# Patient Record
Sex: Male | Born: 1990 | Race: Black or African American | Hispanic: No | Marital: Married | Smoking: Former smoker
Health system: Southern US, Community
[De-identification: ages and names within clinical notes are randomized; demographics above are authoritative.]

## PROBLEM LIST (undated history)

## (undated) DIAGNOSIS — N400 Enlarged prostate without lower urinary tract symptoms: Secondary | ICD-10-CM

---

## 2020-02-16 ENCOUNTER — Encounter: Payer: Self-pay | Admitting: Emergency Medicine

## 2020-02-16 ENCOUNTER — Emergency Department
Admission: EM | Admit: 2020-02-16 | Discharge: 2020-02-16 | Disposition: A | Discharge status: Home / Self Care | Payer: BC Managed Care – PPO | Attending: Emergency Medicine | Admitting: Emergency Medicine

## 2020-02-16 ENCOUNTER — Other Ambulatory Visit: Payer: Self-pay

## 2020-02-16 ENCOUNTER — Emergency Department: Payer: BC Managed Care – PPO

## 2020-02-16 DIAGNOSIS — I301 Infective pericarditis: Secondary | ICD-10-CM

## 2020-02-16 DIAGNOSIS — Z87891 Personal history of nicotine dependence: Secondary | ICD-10-CM | POA: Insufficient documentation

## 2020-02-16 DIAGNOSIS — R079 Chest pain, unspecified: Secondary | ICD-10-CM | POA: Diagnosis present

## 2020-02-16 DIAGNOSIS — R071 Chest pain on breathing: Secondary | ICD-10-CM | POA: Diagnosis not present

## 2020-02-16 DIAGNOSIS — B3323 Viral pericarditis: Secondary | ICD-10-CM | POA: Insufficient documentation

## 2020-02-16 HISTORY — DX: Benign prostatic hyperplasia without lower urinary tract symptoms: N40.0

## 2020-02-16 LAB — COMPREHENSIVE METABOLIC PANEL
ALT: 20 U/L (ref 0–44)
AST: 42 U/L — ABNORMAL HIGH (ref 15–41)
Albumin: 4.4 g/dL (ref 3.5–5.0)
Alkaline Phosphatase: 42 U/L (ref 38–126)
Anion gap: 9 (ref 5–15)
BUN: 11 mg/dL (ref 6–20)
CO2: 26 mmol/L (ref 22–32)
Calcium: 9.3 mg/dL (ref 8.9–10.3)
Chloride: 102 mmol/L (ref 98–111)
Creatinine, Ser: 1 mg/dL (ref 0.61–1.24)
GFR calc Af Amer: >60 mL/min (ref >60)
GFR calc non Af Amer: >60 mL/min (ref >60)
Glucose, Bld: 118 mg/dL — ABNORMAL HIGH (ref 70–99)
Potassium: 3.4 mmol/L — ABNORMAL LOW (ref 3.5–5.1)
Sodium: 137 mmol/L (ref 135–145)
Total Bilirubin: 0.7 mg/dL (ref 0.3–1.2)
Total Protein: 7.8 g/dL (ref 6.5–8.1)

## 2020-02-16 LAB — TROPONIN I (HIGH SENSITIVITY)
Troponin I (High Sensitivity): 2 ng/L (ref <18)
Troponin I (High Sensitivity): <2 ng/L (ref <18)

## 2020-02-16 LAB — CBC
HCT: 43.1 % (ref 39.0–52.0)
Hemoglobin: 15.2 g/dL (ref 13.0–17.0)
MCH: 29.9 pg (ref 26.0–34.0)
MCHC: 35.3 g/dL (ref 30.0–36.0)
MCV: 84.7 fL (ref 80.0–100.0)
Platelets: 267 10*3/uL (ref 150–400)
RBC: 5.09 MIL/uL (ref 4.22–5.81)
RDW: 13 % (ref 11.5–15.5)
WBC: 7.5 10*3/uL (ref 4.0–10.5)
nRBC: 0 % (ref 0.0–0.2)

## 2020-02-16 LAB — SEDIMENTATION RATE: Sed Rate: 8 mm/hr (ref 0–15)

## 2020-02-16 LAB — LIPASE, BLOOD: Lipase: 34 U/L (ref 11–51)

## 2020-02-16 MED ORDER — LORAZEPAM 0.5 MG PO TABS
0.5000 mg | ORAL_TABLET | Freq: Once | ORAL | Status: AC
  Administered 2020-02-16: 0.5 mg via ORAL
  Filled 2020-02-16: qty 1

## 2020-02-16 MED ORDER — KETOROLAC TROMETHAMINE 30 MG/ML IJ SOLN
INTRAMUSCULAR | Status: AC
  Administered 2020-02-16: 15 mg via INTRAVENOUS
  Filled 2020-02-16: qty 1

## 2020-02-16 MED ORDER — COLCHICINE 0.6 MG PO TABS: 0.6000 mg | ORAL_TABLET | Freq: Two times a day (BID) | ORAL | 0 refills | Status: DC

## 2020-02-16 MED ORDER — KETOROLAC TROMETHAMINE 30 MG/ML IJ SOLN: 15.0000 mg | Freq: Once | INTRAMUSCULAR | Status: AC

## 2020-02-16 MED ORDER — COLCHICINE 0.6 MG PO TABS
0.6000 mg | ORAL_TABLET | Freq: Once | ORAL | Status: AC
  Administered 2020-02-16: 0.6 mg via ORAL
  Filled 2020-02-16: qty 1

## 2020-02-16 NOTE — Discharge Instructions (Addendum)
You were seen in the ED because of your chest pain.  Due to your EKG changes and story, I am concerned about acute pericarditis.  This is inflammation of the sac around your heart.  Young people often get this after a viral infection like a cold.  Thankfully, this is very treatable.  Use a combination of anti-inflammatory medicines and colchicine.  You are being discharged with a prescription for colchicine to take twice daily for the next 3 months. Use ibuprofen in conjunction with this.  Please follow-up with your PCP within the next 3-5 days to get a repeat EKG and to ensure your symptoms are improving.  If you develop any worsening symptoms of chest pain, any fevers or passing out with your symptoms, please return to the ED.

## 2020-02-16 NOTE — ED Triage Notes (Signed)
Patient states that he started having central chest pain with shortness of breath that started about 17:30 today. Patient denies nausea or vomiting.

## 2020-02-16 NOTE — ED Notes (Signed)
Explanation of repeat troponin time provided to pt who verbalizes understanding.

## 2020-02-16 NOTE — ED Provider Notes (Signed)
[facility] Emergency Department Provider Note ____________________________________________   First MD Initiated Contact with Patient 02/16/20 1959     (approximate)  I have reviewed the triage vital signs and the nursing notes.  HISTORY  Chief Complaint Chest Pain   HPI Eddie Williamson is a 29 y.o. malewho presents to the ED for evaluation of chest pain.   Chart review indicates no relevant medical history. Patient denies any cardiac history or family history of early cardiac disease/death.  He does report that his older sister had a stroke in her 30s.  Patient presents with his wife who provides supplemental history.  Patient reports a sore throat and "the sniffles" 2-3 days ago that have since self resolved.  He reports going to a soccer game with his child this afternoon, feeling well, then taking a nap after this.  He reports waking up from a nap at about 1730 with severe chest pain.  He reports 8/10 intensity central chest pain that is nonradiating, aching in nature and he has not taken any medications prior to arrival to help alleviate this pain.  He has never felt like this before.  He reports pain is pleuritic and positional, worsening with leaning forward.    Past Medical History:  Diagnosis Date  . Enlarged prostate     There are no problems to display for this patient.   History reviewed. No pertinent surgical history.  Prior to Admission medications   Medication Sig Start Date End Date Taking? Authorizing Provider  colchicine 0.6 MG tablet Take 1 tablet (0.6 mg total) by mouth 2 (two) times daily. 02/16/20 05/16/20  Delton Prairie, MD    Allergies Gramineae pollens  No family history on file.  Social History Social History   Tobacco Use  . Smoking status: Former Games developer  . Smokeless tobacco: Never Used  Substance Use Topics  . Alcohol use: Yes  . Drug use: Yes    Types: Marijuana    Review of Systems  Constitutional: No  fever/chills Eyes: No visual changes. ENT: No sore throat. Cardiovascular: Positive for chest pain. Respiratory: Denies shortness of breath. Gastrointestinal: No abdominal pain.  No nausea, no vomiting.  No diarrhea.  No constipation. Genitourinary: Negative for dysuria. Musculoskeletal: Negative for back pain. Skin: Negative for rash. Neurological: Negative for headaches, focal weakness or numbness.  ____________________________________________   PHYSICAL EXAM:  VITAL SIGNS: Vitals:   02/16/20 2200 02/16/20 2230  BP: 116/79 120/78  Pulse: (!) 57 63  Resp: 14 16  Temp:    SpO2: 100% 99%      Constitutional: Alert and oriented. Uncomfortable-appearing, but conversational in full sentences.  Eyes: Conjunctivae are normal. PERRL. EOMI. Head: Atraumatic. Nose: No congestion/rhinnorhea. Mouth/Throat: Mucous membranes are moist.  Oropharynx non-erythematous. Neck: No stridor. No cervical spine tenderness to palpation. Cardiovascular: Normal rate, regular rhythm. Grossly normal heart sounds.  Good peripheral circulation. Respiratory: Normal respiratory effort.  No retractions. Lungs CTAB. Gastrointestinal: Soft , nondistended, nontender to palpation. No abdominal bruits. No CVA tenderness. Musculoskeletal: No lower extremity tenderness nor edema.  No joint effusions. No signs of acute trauma. Neurologic:  Normal speech and language. No gross focal neurologic deficits are appreciated. No gait instability noted. Skin:  Skin is warm, dry and intact. No rash noted. Psychiatric: Mood and affect are normal. Speech and behavior are normal.  ____________________________________________   LABS (all labs ordered are listed, but only abnormal results are displayed)  Labs Reviewed  COMPREHENSIVE METABOLIC PANEL - Abnormal; Notable for the following  components:      Result Value   Potassium 3.4 (*)    Glucose, Bld 118 (*)    AST 42 (*)    All other components within normal limits    CBC  LIPASE, BLOOD  SEDIMENTATION RATE  C-REACTIVE PROTEIN  TROPONIN I (HIGH SENSITIVITY)  TROPONIN I (HIGH SENSITIVITY)   ____________________________________________  12 Lead EKG   sinus rhythm, rate of 59 bpm.  Normal axis and intervals.  ST segment elevation diffusely throughout precordial, aVR, inferior leads.  No reciprocal changes or STEMI criteria. ____________________________________________  RADIOLOGY  ED MD interpretation: 2 view CXR without evidence of acute cardiopulmonary pathology.  Bedside point-of-care cardiac ultrasound without evidence of pericardial effusion.  Official radiology report(s): DG Chest 2 View  Result Date: 02/16/2020 CLINICAL DATA:  Central chest pain and shortness of breath starting at 17:30 today. EXAM: CHEST - 2 VIEW COMPARISON:  None. FINDINGS: The heart size and mediastinal contours are within normal limits. Both lungs are clear. The visualized skeletal structures are unremarkable. IMPRESSION: No active cardiopulmonary disease. Electronically Signed   By: Burman Nieves M.D.   On: 02/16/2020 20:05    ____________________________________________   PROCEDURES and INTERVENTIONS  Procedure(s) performed (including Critical Care):  Procedures  Medications  ketorolac (TORADOL) 30 MG/ML injection 15 mg (15 mg Intravenous Given 02/16/20 2032)  colchicine tablet 0.6 mg (0.6 mg Oral Given 02/16/20 2140)  LORazepam (ATIVAN) tablet 0.5 mg (0.5 mg Oral Given 02/16/20 2057)    ____________________________________________   MDM / ED COURSE  Otherwise healthy 29 year old man presents with positional and pleuritic chest pain, most consistent with acute pericarditis, and amenable to outpatient management.  Normal vital signs on room air without tachycardia or hypoxia.  Exam demonstrates uncomfortable-appearing patient who has positional chest pain who prefers to sit upright in bed.  He has no signs of trauma, neurovascular deficits or distress.  Blood  work is unremarkable without acute pathology.  His troponin is negative, inflammatory markers are low.  His EKG shows diffuse ST segment elevation without reciprocal changes, and without a previous to compare to, and most concerned about acute pericarditis.  He is anxious and tolerates a single dose of oral Ativan with improving discomfort.  With the positional nature of his pain and EKG changes, I think it is reasonable to treat for acute pericarditis despite his blood work being normal.  Bedside ultrasound does not demonstrate pericardial effusion.  I discussed this with the patient, and he agrees with empiric treatment.  We discussed 3 months of colchicine and NSAIDs.  We discussed following up with his PCP within the next 3 days for repeat EKG and ensure symptoms are improving.  We discussed return precautions for the ED.  Patient medically stable discharge home.   Clinical Course as of Feb 16 2308  Sat Feb 16, 2020  2040 Bedside ultrasound complete without pericardial effusion.   [DS]  2153 Reassessed.  Second troponin in process.  Patient reports resolving symptoms.  Discussed diagnosis of pericarditis with patient and wife.  We talked about NSAIDs and colchicine for 3 months.  We talked about PCP follow-up and possible cardiology referral.  We discussed outpatient management and reduced exercise.  We discussed return precautions for the ED.   [DS]    Clinical Course User Index [DS] Delton Prairie, MD     ____________________________________________   FINAL CLINICAL IMPRESSION(S) / ED DIAGNOSES  Final diagnoses:  Acute viral pericarditis  Chest pain on breathing     ED Discharge Orders  Ordered    colchicine 0.6 MG tablet  2 times daily        02/16/20 2203           Eddie Williamson   Note:  This document was prepared using Dragon voice recognition software and may include unintentional dictation errors.   Delton Prairie, MD 02/16/20 [phone]

## 2020-02-16 NOTE — ED Notes (Signed)
Patient transported to X-ray 

## 2020-02-17 LAB — C-REACTIVE PROTEIN: CRP: 0.6 mg/dL (ref <1.0)

## 2020-06-12 ENCOUNTER — Other Ambulatory Visit: Payer: Self-pay

## 2020-06-12 ENCOUNTER — Emergency Department: Payer: Commercial Managed Care - PPO

## 2020-06-12 ENCOUNTER — Emergency Department
Admission: EM | Admit: 2020-06-12 | Discharge: 2020-06-12 | Disposition: A | Discharge status: Home / Self Care | Payer: Commercial Managed Care - PPO | Attending: Emergency Medicine | Admitting: Emergency Medicine

## 2020-06-12 ENCOUNTER — Encounter: Payer: Self-pay | Admitting: Emergency Medicine

## 2020-06-12 DIAGNOSIS — R072 Precordial pain: Secondary | ICD-10-CM | POA: Diagnosis present

## 2020-06-12 DIAGNOSIS — B3323 Viral pericarditis: Secondary | ICD-10-CM | POA: Diagnosis not present

## 2020-06-12 DIAGNOSIS — R071 Chest pain on breathing: Secondary | ICD-10-CM

## 2020-06-12 DIAGNOSIS — Z87891 Personal history of nicotine dependence: Secondary | ICD-10-CM | POA: Diagnosis not present

## 2020-06-12 DIAGNOSIS — U071 COVID-19: Secondary | ICD-10-CM

## 2020-06-12 DIAGNOSIS — I301 Infective pericarditis: Secondary | ICD-10-CM

## 2020-06-12 LAB — CBC
HCT: 42.4 % (ref 39.0–52.0)
Hemoglobin: 14.2 g/dL (ref 13.0–17.0)
MCH: 28.8 pg (ref 26.0–34.0)
MCHC: 33.5 g/dL (ref 30.0–36.0)
MCV: 86 fL (ref 80.0–100.0)
Platelets: 233 10*3/uL (ref 150–400)
RBC: 4.93 MIL/uL (ref 4.22–5.81)
RDW: 13.1 % (ref 11.5–15.5)
WBC: 4.2 10*3/uL (ref 4.0–10.5)
nRBC: 0 % (ref 0.0–0.2)

## 2020-06-12 LAB — SEDIMENTATION RATE: Sed Rate: 5 mm/hr (ref 0–15)

## 2020-06-12 LAB — BASIC METABOLIC PANEL
Anion gap: 10 (ref 5–15)
BUN: 17 mg/dL (ref 6–20)
CO2: 29 mmol/L (ref 22–32)
Calcium: 9.5 mg/dL (ref 8.9–10.3)
Chloride: 100 mmol/L (ref 98–111)
Creatinine, Ser: 0.8 mg/dL (ref 0.61–1.24)
GFR, Estimated: >60 mL/min (ref >60)
Glucose, Bld: 115 mg/dL — ABNORMAL HIGH (ref 70–99)
Potassium: 4.3 mmol/L (ref 3.5–5.1)
Sodium: 139 mmol/L (ref 135–145)

## 2020-06-12 LAB — TROPONIN I (HIGH SENSITIVITY)
Troponin I (High Sensitivity): <2 ng/L (ref <18)
Troponin I (High Sensitivity): 2 ng/L (ref <18)

## 2020-06-12 MED ORDER — COLCHICINE 0.6 MG PO TABS
0.6000 mg | ORAL_TABLET | Freq: Two times a day (BID) | ORAL | 0 refills | Status: AC
Start: 2020-06-12 — End: 2020-09-10

## 2020-06-12 NOTE — ED Notes (Signed)
Dr. Bradler at bedside 

## 2020-06-12 NOTE — Discharge Instructions (Addendum)
You can also use ibuprofen 600-800 mg every 8 hours for 7 days for any continued pain

## 2020-06-12 NOTE — ED Triage Notes (Addendum)
C/O lower mid chest pain.  Onset this morning.  Patient states he had pericarditis last fall, and pain is similar.  Tested COVID + 06/04/2020  AAOx3.  Skin warm and dry. NAD

## 2020-06-12 NOTE — ED Provider Notes (Signed)
[facility] Emergency Department Provider Note   ____________________________________________   Event Date/Time   First MD Initiated Contact with Patient 06/12/20 1131     (approximate)  I have reviewed the triage vital signs and the nursing notes.   HISTORY  Chief Complaint Chest Pain    HPI Eddie Williamson is a 30 y.o. male with a stated past medical history of of BPH and pericarditis in the past who presents for lower midsternal left-sided chest pain.  Patient describes aching, pleuritic, mid-left sternal chest pain that does not radiate and is worsened with palpation or inspiration.  Patient states that this feels similar to his pericarditis pain that he had last fall.  Patient describes it as 9/10 in severity and worsened to 10/10 with any deep breaths or palpation.  Patient currently denies any vision changes, tinnitus, difficulty speaking, facial droop, sore throat, shortness of breath, abdominal pain, nausea/vomiting/diarrhea, dysuria, or weakness/numbness/paresthesias in any extremity         Past Medical History:  Diagnosis Date  . Enlarged prostate     There are no problems to display for this patient.   History reviewed. No pertinent surgical history.  Prior to Admission medications   Medication Sig Start Date End Date Taking? Authorizing Provider  colchicine 0.6 MG tablet Take 1 tablet (0.6 mg total) by mouth 2 (two) times daily. 06/12/20 09/10/20  Merwyn Katos, MD    Allergies Gramineae pollens  No family history on file.  Social History Social History   Tobacco Use  . Smoking status: Former Games developer  . Smokeless tobacco: Never Used  Substance Use Topics  . Alcohol use: Yes  . Drug use: Yes    Types: Marijuana    Review of Systems Constitutional: No fever/chills Eyes: No visual changes. ENT: No sore throat. Cardiovascular: Endorses chest pain. Respiratory: Denies shortness of breath. Gastrointestinal: No abdominal  pain.  No nausea, no vomiting.  No diarrhea. Genitourinary: Negative for dysuria. Musculoskeletal: Negative for acute arthralgias Skin: Negative for rash. Neurological: Negative for headaches, weakness/numbness/paresthesias in any extremity Psychiatric: Negative for suicidal ideation/homicidal ideation   ____________________________________________   PHYSICAL EXAM:  VITAL SIGNS: ED Triage Vitals  Enc Vitals Group     BP 06/12/20 0804 121/67     Pulse Rate 06/12/20 0804 60     Resp 06/12/20 0804 16     Temp 06/12/20 0804 98.9 F (37.2 C)     Temp Source 06/12/20 0804 Oral     SpO2 06/12/20 0804 100 %     Weight 06/12/20 0803 210 lb 1.6 oz (95.3 kg)     Height 06/12/20 0803 6\' 2"  (1.88 m)     Head Circumference --      Peak Flow --      Pain Score 06/12/20 0803 4     Pain Loc --      Pain Edu? --      Excl. in GC? --    Constitutional: Alert and oriented. Well appearing and in no acute distress. Eyes: Conjunctivae are normal. PERRL. Head: Atraumatic. Nose: No congestion/rhinnorhea. Mouth/Throat: Mucous membranes are moist. Neck: No stridor Cardiovascular: Grossly normal heart sounds.  Good peripheral circulation. Bedside echocardiogram shows normal ventricular wall motion and no valvular abnormalities with peripheral enhancement of the pericardium Respiratory: Normal respiratory effort.  No retractions. Gastrointestinal: Soft and nontender. No distention. Musculoskeletal: No obvious deformities.  Tenderness to palpation over the chest wall in the lateral left aspect of the lower sternum Neurologic:  Normal  speech and language. No gross focal neurologic deficits are appreciated. Skin:  Skin is warm and dry. No rash noted. Psychiatric: Mood and affect are normal. Speech and behavior are normal.  ____________________________________________   LABS (all labs ordered are listed, but only abnormal results are displayed)  Labs Reviewed  BASIC METABOLIC PANEL - Abnormal;  Notable for the following components:      Result Value   Glucose, Bld 115 (*)    All other components within normal limits  CBC  SEDIMENTATION RATE  TROPONIN I (HIGH SENSITIVITY)  TROPONIN I (HIGH SENSITIVITY)   ____________________________________________  EKG  ED ECG REPORT I, Merwyn Katos, the attending physician, personally viewed and interpreted this ECG.  Date: 06/12/2020 EKG Time: 0759 Rate: 72 Rhythm: normal sinus rhythm QRS Axis: normal Intervals: normal ST/T Wave abnormalities: normal Narrative Interpretation: no evidence of acute ischemia  ____________________________________________  RADIOLOGY  ED MD interpretation: 2 view x-ray of the chest shows no evidence of acute abnormalities including no pneumonia, pneumothorax, or widened mediastinum  Official radiology report(s): DG Chest 2 View  Result Date: 06/12/2020 CLINICAL DATA:  Chest pain. EXAM: CHEST - 2 VIEW COMPARISON:  February 16, 2020. FINDINGS: The heart size and mediastinal contours are within normal limits. Both lungs are clear. No pneumothorax or pleural effusion is noted. The visualized skeletal structures are unremarkable. IMPRESSION: No active cardiopulmonary disease. Electronically Signed   By: Lupita Raider M.D.   On: 06/12/2020 08:27    ____________________________________________   PROCEDURES  Procedure(s) performed (including Critical Care):  .1-3 Lead EKG Interpretation Performed by: Merwyn Katos, MD Authorized by: Merwyn Katos, MD     Interpretation: normal     ECG rate:  75   ECG rate assessment: normal     Rhythm: sinus rhythm     Ectopy: none     Conduction: normal       ____________________________________________   INITIAL IMPRESSION / ASSESSMENT AND PLAN / ED COURSE  As part of my medical decision making, I reviewed the following data within the electronic MEDICAL RECORD NUMBER Nursing notes reviewed and incorporated, {Labs reviewed, EKG interpreted, Old chart  reviewed, Radiograph reviewed and Notes from prior ED visits reviewed and incorporated        Workup: ECG, CXR, CBC, BMP, Troponin Findings: ECG: No overt evidence of STEMI. No evidence of Brugadas sign, delta wave, epsilon wave, significantly prolonged QTc, or malignant arrhythmia HS Troponin: Negative x1 Other Labs unremarkable for emergent problems. CXR: Without PTX, PNA, or widened mediastinum Last Stress Test: Never Last Heart Catheterization: Never HEART Score: 1  Given History, Exam, and Workup I have low suspicion for ACS, Pneumothorax, Pneumonia, Pulmonary Embolus, Tamponade, Aortic Dissection or other emergent problem as a cause for this presentation.  Given peripheral enhancement on bedside echo, it is reasonable to treat patient empirically for recurrence of his pericarditis with prescription for colchicine and follow-up with his primary care physician. Reassesment: Prior to discharge patients pain was controlled and they were well appearing.  Disposition:  Discharge. Strict return precautions discussed with patient with full understanding. Advised patient to follow up promptly with primary care provider       ____________________________________________   FINAL CLINICAL IMPRESSION(S) / ED DIAGNOSES  Final diagnoses:  Chest pain on breathing  COVID-19 virus infection  Acute viral pericarditis     ED Discharge Orders         Ordered    colchicine 0.6 MG tablet  2 times daily  06/12/20 1152           Note:  This document was prepared using Dragon voice recognition software and may include unintentional dictation errors.   Merwyn Katos, MD 06/12/20 [phone]

## 2021-09-04 IMAGING — CR DG CHEST 2V
1 series · 2 of 2 positions shown · non-contrast
Comparison: None.

CLINICAL DATA: Central chest pain and shortness of breath starting
at [DATE] today.

EXAM:
CHEST - 2 VIEW

[Series 1: dg chest 2 view · 0.14mm/px · 2 of 2 slices shown]
[im 1/2]
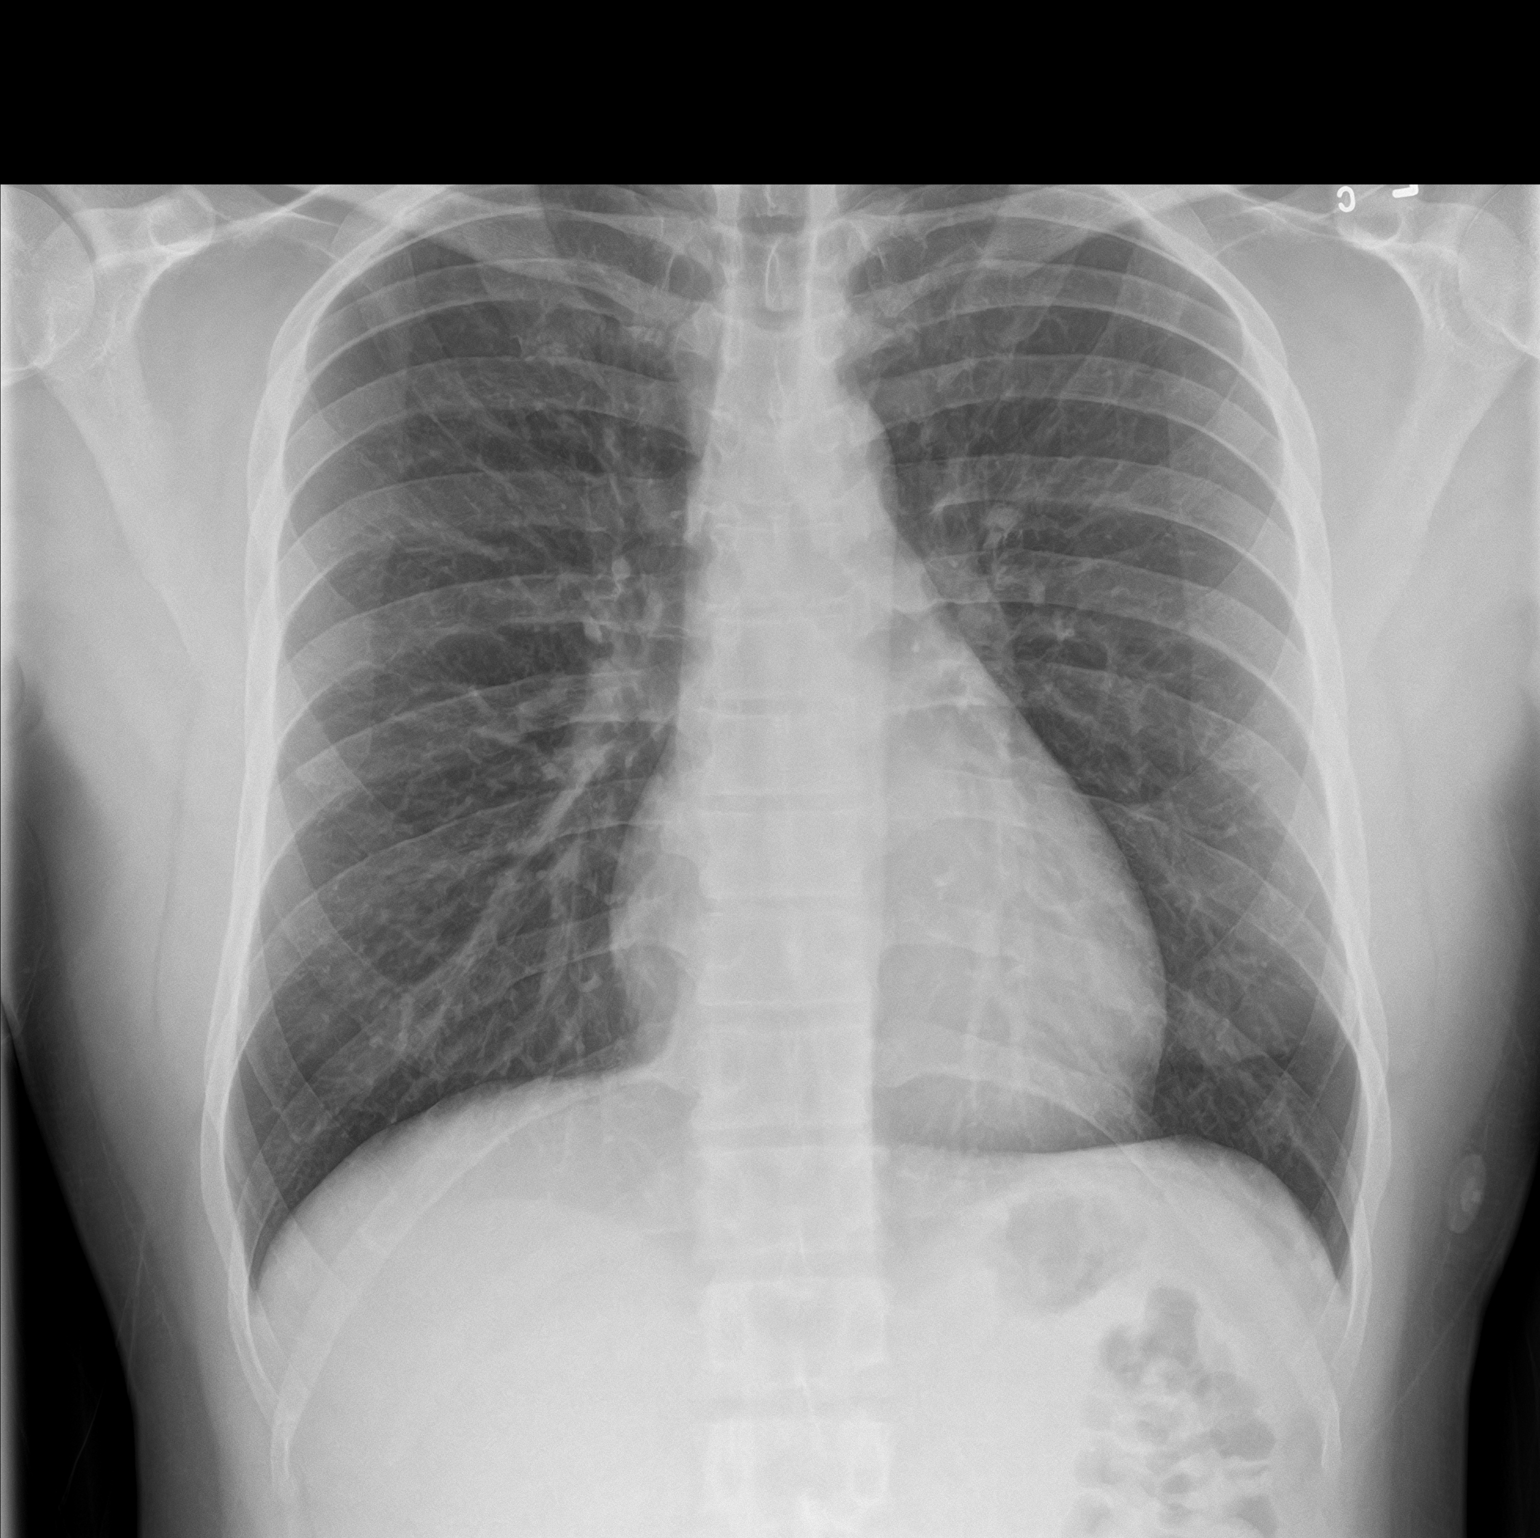
[im 2/2]
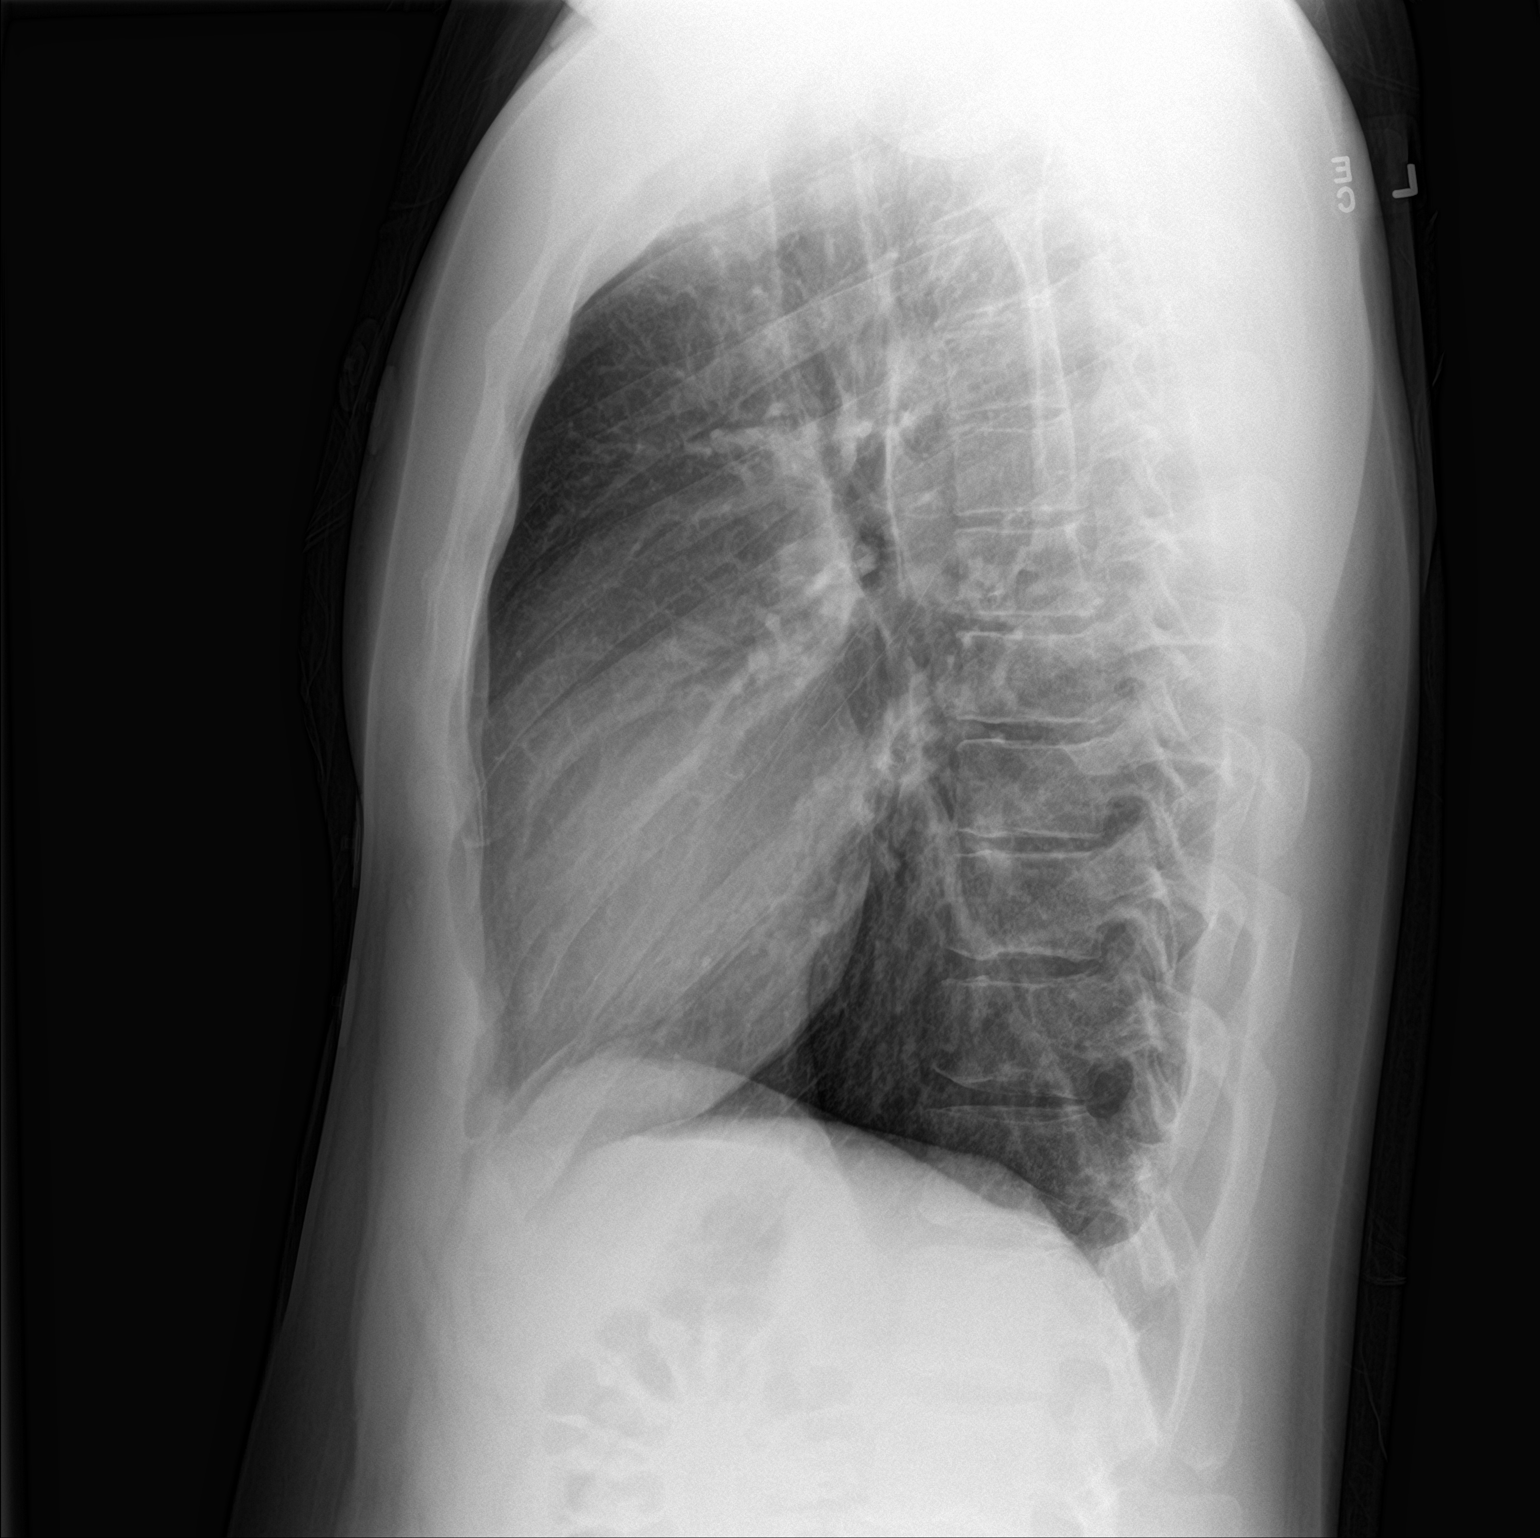

[2 of 2 positions shown; findings below may reference images not displayed]

FINDINGS: The heart size and mediastinal contours are within normal limits.
Both lungs are clear. The visualized skeletal structures are
unremarkable.
IMPRESSION: No active cardiopulmonary disease.
